# Patient Record
Sex: Male | Born: 1970 | Race: White | Hispanic: No | Marital: Married | Smoking: Never smoker
Health system: Southern US, Community
[De-identification: ages and names within clinical notes are randomized; demographics above are authoritative.]

## PROBLEM LIST (undated history)

## (undated) DIAGNOSIS — R7989 Other specified abnormal findings of blood chemistry: Secondary | ICD-10-CM

## (undated) DIAGNOSIS — E785 Hyperlipidemia, unspecified: Secondary | ICD-10-CM

## (undated) DIAGNOSIS — G4730 Sleep apnea, unspecified: Secondary | ICD-10-CM

## (undated) DIAGNOSIS — E079 Disorder of thyroid, unspecified: Secondary | ICD-10-CM

---

## 2015-11-07 ENCOUNTER — Encounter: Payer: Self-pay | Admitting: Emergency Medicine

## 2015-11-07 ENCOUNTER — Emergency Department (INDEPENDENT_AMBULATORY_CARE_PROVIDER_SITE_OTHER)
Admission: EM | Admit: 2015-11-07 | Discharge: 2015-11-07 | Disposition: A | Discharge status: Home / Self Care | Payer: Worker's Compensation | Source: Home / Self Care | Attending: Family Medicine | Admitting: Family Medicine

## 2015-11-07 DIAGNOSIS — Z026 Encounter for examination for insurance purposes: Secondary | ICD-10-CM

## 2015-11-07 DIAGNOSIS — S39012D Strain of muscle, fascia and tendon of lower back, subsequent encounter: Secondary | ICD-10-CM | POA: Diagnosis not present

## 2015-11-07 NOTE — ED Provider Notes (Signed)
CSN: [id number]     Arrival date & time 11/07/15  1455 History   First MD Initiated Contact with Patient 11/07/15 1509     Chief Complaint  Patient presents with  . Back Pain   (Consider location/radiation/quality/duration/timing/severity/associated sxs/prior Treatment) HPI  Pt is a 44yo male who works for [employer], presenting today for a f/u for a work related Left lower back injury that occurred initially on 10/12/15 and was seen initially on 10/21/15 in occupational health.  Pt was restricted for no lifting >5 pounds. Pt states the pain is gradually improving in his Left lower back so he has been trying to do a little more at work but another employee "called him out" for lifting more than he was cleared for.  Pt would like to increase his restriction to 10 pounds.  Pt states he is waiting to hear back from occupational health about a follow up visit as he was advised they are working on a referral to a specialist for his back.  Pain is still aching and sore on occasion. Denies new injuries or new symptoms. Denies change in bowel or bladder habit. Pain does not radiate to legs. Denies numbness in legs or groin.  He still takes meloxicam and flexeril as prescribed by occupational health. These medications do help pt.    History reviewed. No pertinent past medical history. History reviewed. No pertinent past surgical history. History reviewed. No pertinent family history. Social History  Substance Use Topics  . Smoking status: Never Smoker   . Smokeless tobacco: None  . Alcohol Use: Yes    Review of Systems  Constitutional: Negative for fever and chills.  Gastrointestinal: Negative for nausea, vomiting, abdominal pain and diarrhea.  Genitourinary: Negative for dysuria, frequency, hematuria and flank pain.  Musculoskeletal: Positive for myalgias and back pain. Negative for neck pain and neck stiffness.  Neurological: Negative for weakness and numbness.    Allergies  Review of patient's  allergies indicates not on file.  Home Medications   Prior to Admission medications   Medication Sig Start Date End Date Taking? Authorizing Provider  meloxicam (MOBIC) 15 MG tablet Take 15 mg by mouth daily.   Yes Historical Provider, MD   Meds Ordered and Administered this Visit  Medications - No data to display  BP 138/84 mmHg  Pulse 90  Temp(Src) 98.6 F (37 C) (Oral)  Wt 258 lb (117.028 kg)  SpO2 99% No data found.   Physical Exam  Constitutional: He is oriented to person, place, and time. He appears well-developed and well-nourished.  HENT:  Head: Normocephalic and atraumatic.  Eyes: EOM are normal.  Neck: Normal range of motion. Neck supple.  No midline bone tenderness, no crepitus or step-offs.    Cardiovascular: Normal rate.   Pulmonary/Chest: Effort normal.  Musculoskeletal: Normal range of motion. He exhibits tenderness. He exhibits no edema.  No midline spinal tenderness. Mild tenderness to Left lower lumbar muscles. No masses palpated. Full ROM upper and lower extremities with 5/5 strength bilaterally   Neurological: He is alert and oriented to person, place, and time.  Reflex Scores:      Patellar reflexes are 2+ on the right side and 2+ on the left side. Normal sensation in upper and lower extremities bilaterally. Normal gait  Skin: Skin is warm and dry. No rash noted. No erythema.  Psychiatric: He has a normal mood and affect. His behavior is normal.  Nursing note and vitals reviewed.   ED Course  Procedures (including critical care  time)  Labs Review Labs Reviewed - No data to display  Imaging Review No results found.    MDM   1. Low back strain, subsequent encounter   2. Encounter related to worker's compensation claim    Pt requesting decreased restrictions at work as his lower back pain is improving.  No new injury. No red flag symptoms.  Will increase weight to 10 pound limit as pt states he is currently at a 5 pound limit.  Encouraged  to f/u with occupational health, and to call the office of [facility] if he does not hear back by Wednesday 12/21.  Encouraged continued home exercises and alternating ice and heat. Continue to take medications as prescribed. Patient verbalized understanding and agreement with treatment plan.     Junius Finner, PA-C 11/07/15 [phone]

## 2015-11-07 NOTE — ED Notes (Signed)
Pt here for recheck for workers comp injury. States still having back pain when lifting heavy objects.

## 2015-11-12 ENCOUNTER — Telehealth: Payer: Self-pay | Admitting: *Deleted

## 2017-01-19 ENCOUNTER — Other Ambulatory Visit: Payer: Self-pay | Admitting: Emergency Medicine

## 2017-01-19 ENCOUNTER — Ambulatory Visit (INDEPENDENT_AMBULATORY_CARE_PROVIDER_SITE_OTHER): Payer: Self-pay

## 2017-01-19 DIAGNOSIS — R29890 Loss of height: Secondary | ICD-10-CM

## 2017-01-19 DIAGNOSIS — R52 Pain, unspecified: Secondary | ICD-10-CM

## 2017-01-19 DIAGNOSIS — M546 Pain in thoracic spine: Secondary | ICD-10-CM

## 2017-02-18 ENCOUNTER — Other Ambulatory Visit: Payer: Self-pay | Admitting: Emergency Medicine

## 2017-02-18 ENCOUNTER — Ambulatory Visit (INDEPENDENT_AMBULATORY_CARE_PROVIDER_SITE_OTHER): Payer: Self-pay

## 2017-02-18 DIAGNOSIS — Z09 Encounter for follow-up examination after completed treatment for conditions other than malignant neoplasm: Secondary | ICD-10-CM

## 2017-02-18 DIAGNOSIS — M438X4 Other specified deforming dorsopathies, thoracic region: Secondary | ICD-10-CM

## 2017-02-18 DIAGNOSIS — M4606 Spinal enthesopathy, lumbar region: Secondary | ICD-10-CM

## 2017-02-18 DIAGNOSIS — M4604 Spinal enthesopathy, thoracic region: Secondary | ICD-10-CM

## 2018-05-26 ENCOUNTER — Other Ambulatory Visit: Payer: Self-pay | Admitting: Gerontology

## 2018-05-26 ENCOUNTER — Ambulatory Visit (INDEPENDENT_AMBULATORY_CARE_PROVIDER_SITE_OTHER): Payer: Self-pay

## 2018-05-26 DIAGNOSIS — M549 Dorsalgia, unspecified: Secondary | ICD-10-CM

## 2018-05-26 DIAGNOSIS — M5136 Other intervertebral disc degeneration, lumbar region: Secondary | ICD-10-CM

## 2019-09-05 ENCOUNTER — Emergency Department (INDEPENDENT_AMBULATORY_CARE_PROVIDER_SITE_OTHER)
Admission: EM | Admit: 2019-09-05 | Discharge: 2019-09-05 | Disposition: A | Discharge status: Home / Self Care | Payer: No Typology Code available for payment source | Source: Home / Self Care | Attending: Emergency Medicine | Admitting: Emergency Medicine

## 2019-09-05 ENCOUNTER — Encounter: Payer: Self-pay | Admitting: *Deleted

## 2019-09-05 ENCOUNTER — Emergency Department (INDEPENDENT_AMBULATORY_CARE_PROVIDER_SITE_OTHER): Payer: Self-pay

## 2019-09-05 ENCOUNTER — Other Ambulatory Visit: Payer: Self-pay

## 2019-09-05 DIAGNOSIS — S39012A Strain of muscle, fascia and tendon of lower back, initial encounter: Secondary | ICD-10-CM | POA: Diagnosis not present

## 2019-09-05 DIAGNOSIS — S2341XA Sprain of ribs, initial encounter: Secondary | ICD-10-CM

## 2019-09-05 DIAGNOSIS — R0781 Pleurodynia: Secondary | ICD-10-CM

## 2019-09-05 HISTORY — DX: Hyperlipidemia, unspecified: E78.5

## 2019-09-05 HISTORY — DX: Sleep apnea, unspecified: G47.30

## 2019-09-05 HISTORY — DX: Other specified abnormal findings of blood chemistry: R79.89

## 2019-09-05 HISTORY — DX: Disorder of thyroid, unspecified: E07.9

## 2019-09-05 MED ORDER — MELOXICAM 7.5 MG PO TABS: ORAL_TABLET | ORAL | 0 refills | Status: AC

## 2019-09-05 MED ORDER — CYCLOBENZAPRINE HCL 5 MG PO TABS: 5.0000 mg | ORAL_TABLET | Freq: Every day | ORAL | 0 refills | Status: AC

## 2019-09-05 NOTE — Discharge Instructions (Addendum)
X-rays of mid back and left ribs are normal. You have a severe strain of left ribs and left mid back. Please see details of work restrictions and treatment plan as detailed on the [employer] medical report, completed by hand. You need to make an appointment for follow-up at employer health in 2 weeks, on 09/19/2019. We have initiated physical therapy referral

## 2019-09-05 NOTE — ED Provider Notes (Signed)
[facility]    CSN: [id number] Arrival date & time: 09/05/19  1130      History   Chief Complaint Chief Complaint  Patient presents with  . Back Injury    HPI Darrell Franklin is a 48 y.o. male.    Back Pain Pain location: Left rib and left parathoracic back area. 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 He states he had similar on-the-job back injuries in the past, most recently February 2020, and that it took a while for him to recover, but after couple months he eventually recovered and "was 99% better ". Since his injury 08/11/2019, he has tried to work, but bending and lifting and twisting heavy objects seems to make it worse.  Has tried meloxicam, total of 15 mg daily and cyclobenzaprine 5mg  hs for muscle relaxant, which helped somewhat. He states he went to his PCP yesterday, and had a variety of blood tests drawn, as he has been fatigued with various nonspecific myalgias and has a history of hypothyroid and low testosterone.  Denies history of hypertension or diabetes or cardiovascular disease. Associated symptoms: Denies numbness or focal weakness in any extremity. No fever or chills or nausea or vomiting.  No abdominal pain or diarrhea.  No anterior chest pain or shortness of breath.  No cough or congestion.  No history of exposure to anyone with COVID or COVID symptoms.    Past Medical History:  Diagnosis Date  . Hyperlipidemia   . Low testosterone   . Sleep apnea   . Thyroid disease     There are no active problems to display for this patient.   History reviewed. No pertinent  surgical history.     Home Medications    Prior to Admission medications   Medication Sig Start Date End Date Taking? Authorizing Provider  levothyroxine (SYNTHROID) 100 MCG tablet TAKE 1 TABLET (100 MCG TOTAL) BY MOUTH DAILY. 02/25/14  Yes [provider]  cyclobenzaprine (FLEXERIL) 5 MG tablet Take 1 tablet (5 mg total) by mouth at bedtime. For muscle relaxant 09/05/19   Jacqulyn Cane, MD  meloxicam (MOBIC) 7.5 MG tablet Take 1 twice a day as needed for pain. Take with food. (Do not take with any other NSAID.) 09/05/19   Jacqulyn Cane, MD    Family History Family History  Problem Relation Age of Onset  . Diabetes Mother   . Heart attack Father   . Hypertension Father     Social History Social History   Tobacco Use  . Smoking status: Never Smoker  . Smokeless tobacco: Never Used  Substance Use Topics  . Alcohol use: Yes  . Drug use: Never     Allergies   Aspirin Reviewed that aspirin in the past has caused GI upset and nosebleeds, however he has taken meloxicam in the past without any side effects.  Review of Systems Review of Systems  Constitutional: Positive for fatigue.  Musculoskeletal: Positive for back pain.  All other systems reviewed and are negative.  Pertinent items noted in HPI and remainder of comprehensive ROS otherwise negative.   Physical Exam Triage Vital  Signs ED Triage Vitals  Enc Vitals Group     BP 09/05/19 1204 (!) 145/81     Pulse Rate 09/05/19 1204 82     Resp 09/05/19 1204 16     Temp 09/05/19 1204 98.5 F (36.9 C)     Temp Source 09/05/19 1204 Oral     SpO2 09/05/19 1204 99 %     Weight 09/05/19 1206 256 lb (116.1 kg)     Height 09/05/19 1206 5\' 8"  (1.727 m)     Head Circumference --      Peak Flow --      Pain Score 09/05/19 1205 2     Pain Loc --      Pain Edu? --      Excl. in GC? --    No data found.  Updated Vital Signs BP (!) 145/81 (BP Location: Right Arm)   Pulse 82   Temp 98.5 F (36.9 C) (Oral)   Resp  16   Ht 5\' 8"  (1.727 m)   Wt 116.1 kg   SpO2 99%   BMI 38.92 kg/m    Physical Exam Vitals signs reviewed.  Constitutional:      General: He is not in acute distress.    Appearance: He is well-developed. He is not toxic-appearing.     Comments: Pleasant male, overweight.  No acute distress, but appears uncomfortable from left-sided back and rib pain.  HENT:     Head: Normocephalic and atraumatic.     Mouth/Throat:     Mouth: Mucous membranes are moist.  Eyes:     General: No scleral icterus.    Pupils: Pupils are equal, round, and reactive to light.  Neck:     Musculoskeletal: Normal range of motion and neck supple. No neck rigidity or muscular tenderness.  Cardiovascular:     Rate and Rhythm: Normal rate and regular rhythm.  Pulmonary:     Effort: Pulmonary effort is normal.     Breath sounds: Normal breath sounds.  Abdominal:     General: There is no distension.     Tenderness: There is no abdominal tenderness.  Musculoskeletal:     Thoracic back: He exhibits decreased range of motion. He exhibits no bony tenderness and no laceration.     Lumbar back: He exhibits decreased range of motion. He exhibits no bony tenderness.       Back:     Comments: As depicted, area of tenderness and spasm left lateral and posterior lateral ribs and left parathoracic area.  No spinal tenderness or deformity.  No rash.  Skin:    General: Skin is warm and dry.     Capillary Refill: Capillary refill takes less than 2 seconds.     Findings: No rash.  Neurological:     General: No focal deficit present.     Mental Status: He is alert and oriented to person, place, and time.     Cranial Nerves: No cranial nerve deficit.     Sensory: No sensory deficit.     Motor: No weakness.     Deep Tendon Reflexes: Reflexes normal.  Psychiatric:        Mood and Affect: Mood normal.        Behavior: Behavior normal.    12:35 PM.  Initial assessment, likely acute and/or recurrent injury of soft tissues  left ribs and left parathoracic area, but because of tenderness , especially left posterior lateral ribs, will order left rib x-rays now. (He has no point  tenderness over thoracic spine or LS spine, so spinal x-rays not indicated)  UC Treatments / Results  Labs (all labs ordered are listed, but only abnormal results are displayed) Labs Reviewed - No data to display  EKG   Radiology Dg Ribs Unilateral W/chest Left  Result Date: 09/05/2019 CLINICAL DATA:  Left rib pain. EXAM: LEFT RIBS AND CHEST - 3+ VIEW COMPARISON:  Thoracic spine 02/18/2017 FINDINGS: Both lungs are clear. Normal appearance of the heart and mediastinum. Negative for a pneumothorax. Left ribs are intact without a displaced fracture. No acute bone abnormality. IMPRESSION: Negative. Electronically Signed   By: Richarda Overlie M.D.   On: 09/05/2019 12:54    Procedures Procedures (including critical care time)  Medications Ordered in UC Medications - No data to display  Initial Impression / Assessment and Plan / UC Course  I have reviewed the triage vital signs and the nursing notes.  Pertinent labs & imaging results that were available during my care of the patient were reviewed by me and considered in my medical decision making (see chart for details).     Left rib x-rays including 1 view chest x-ray, within normal limits.  No fracture or bony abnormality.-Reviewed this with patient.  I also urged him to follow-up ongoing with his PCP for further evaluation and treatment of fatigue and general myalgias, as he might have metabolic cause for those symptoms.-He voiced understanding Final Clinical Impressions(s) / UC Diagnoses   Final diagnoses:  Back strain, initial encounter  Sprain of costal cartilage, initial encounter     Discharge Instructions     X-rays of mid back and left ribs are normal. You have a severe strain of left ribs and left mid back. Please see details of work restrictions and treatment plan as  detailed on the [employer] medical report, completed by hand. You need to make an appointment for follow-up at employer health in 2 weeks, on 09/19/2019. We have initiated physical therapy referral    ED Prescriptions    Medication Sig Dispense Auth. Provider   meloxicam (MOBIC) 7.5 MG tablet Take 1 twice a day as needed for pain. Take with food. (Do not take with any other NSAID.) 30 tablet Lajean Manes, MD   cyclobenzaprine (FLEXERIL) 5 MG tablet Take 1 tablet (5 mg total) by mouth at bedtime. For muscle relaxant 14 tablet Lajean Manes, MD     Worker's Comp Information  Date of injury: 08/11/2019 Return To Work: 09/06/2019   Work Restrictions: No lifting over 10 pounds.  Please see other specific work restrictions, detailed in the [employer] Report form, completed by hand.  Referral : Physical therapy, 3 times a week x 2 weeks.-In my opinion, this will likely hasten his improvement.  MAKE FOLLOW-UP APPOINTMENT: For 09/19/2019:  [facility] [phone] [address], [address]  [place], [place] [address]    An After Visit Summary was printed and given to the patient. Also, verbal instructions and he was given copy of [employer] Medical Report form.   Lajean Manes, MD 09/05/19 [phone]

## 2019-09-05 NOTE — ED Triage Notes (Addendum)
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

## 2021-03-09 IMAGING — DX DG RIBS W/ CHEST 3+V*L*
5 series · 5 of 5 positions shown · non-contrast
Comparison: Thoracic spine 02/18/2017

CLINICAL DATA: Left rib pain.

EXAM:
LEFT RIBS AND CHEST - 3+ VIEW

[chest pa]
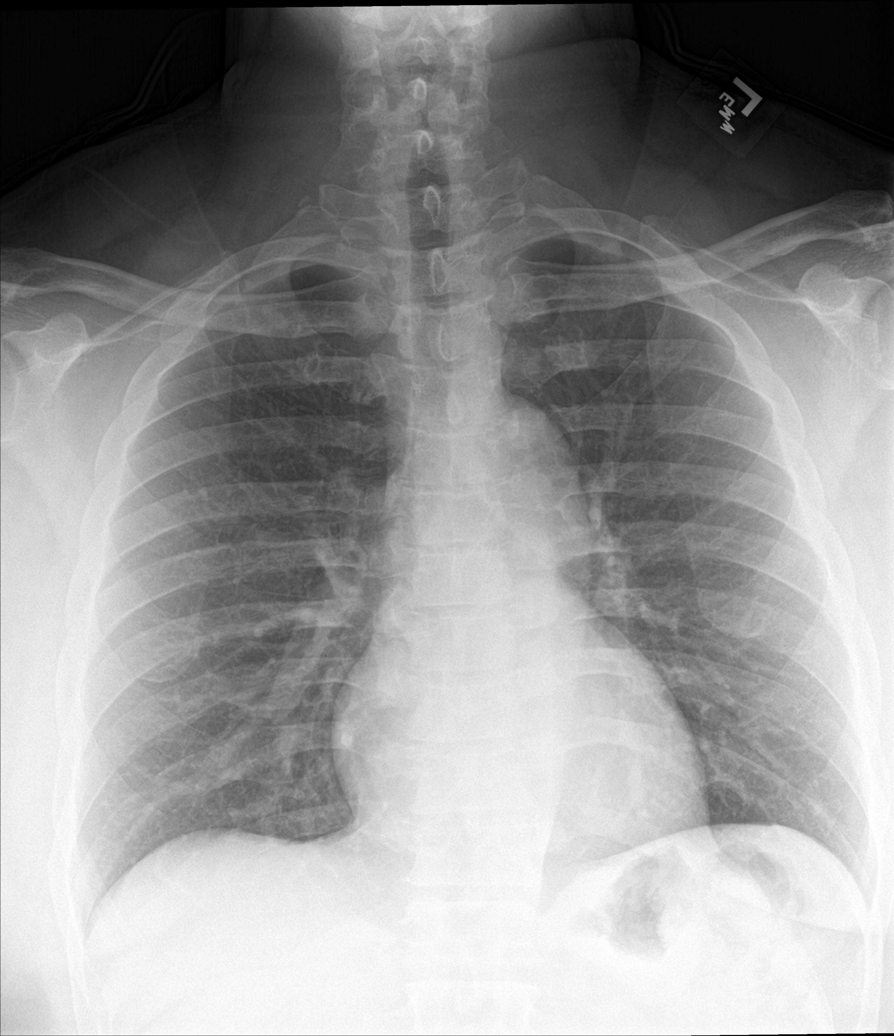

[rib pa (1 of 2)]
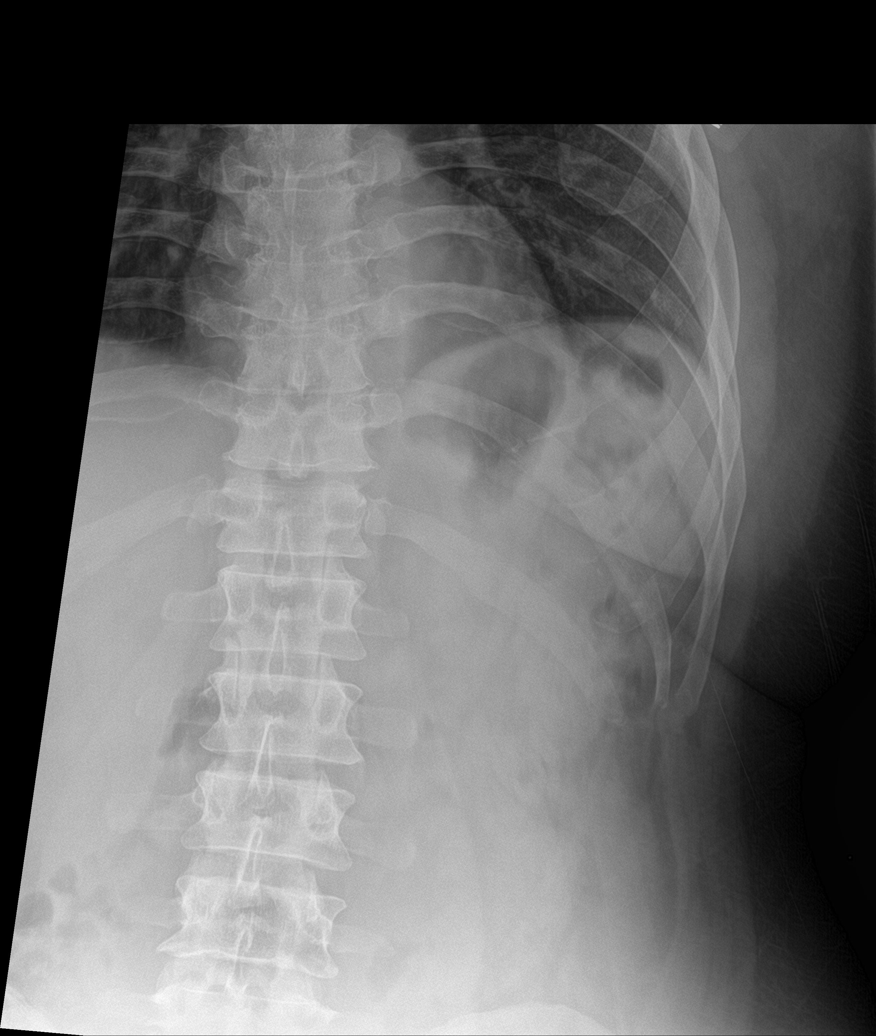

[rib pa (2 of 2)]
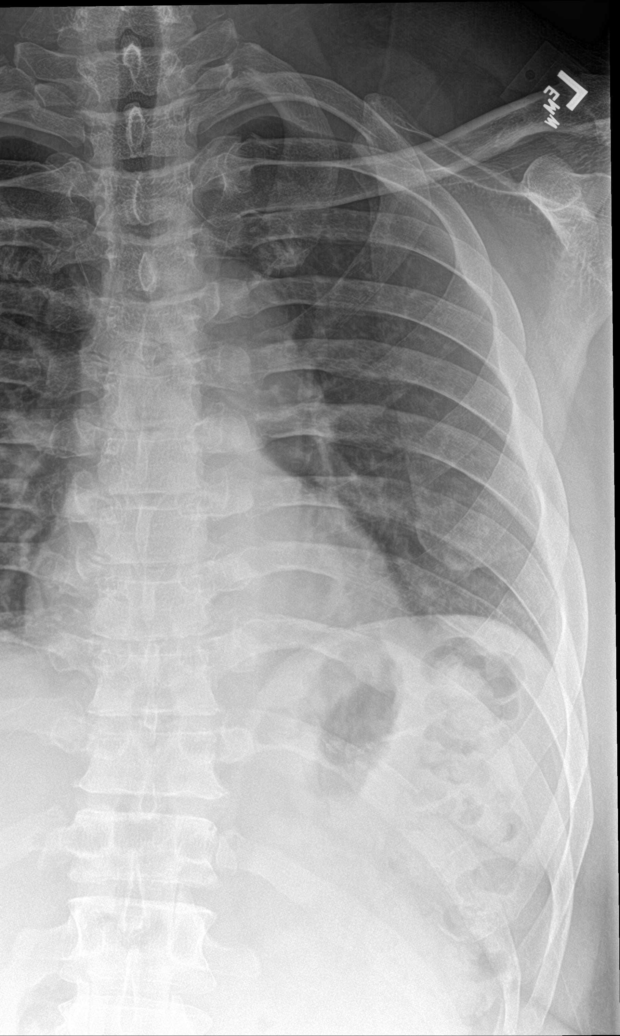

[rib pa obl (1 of 2)]
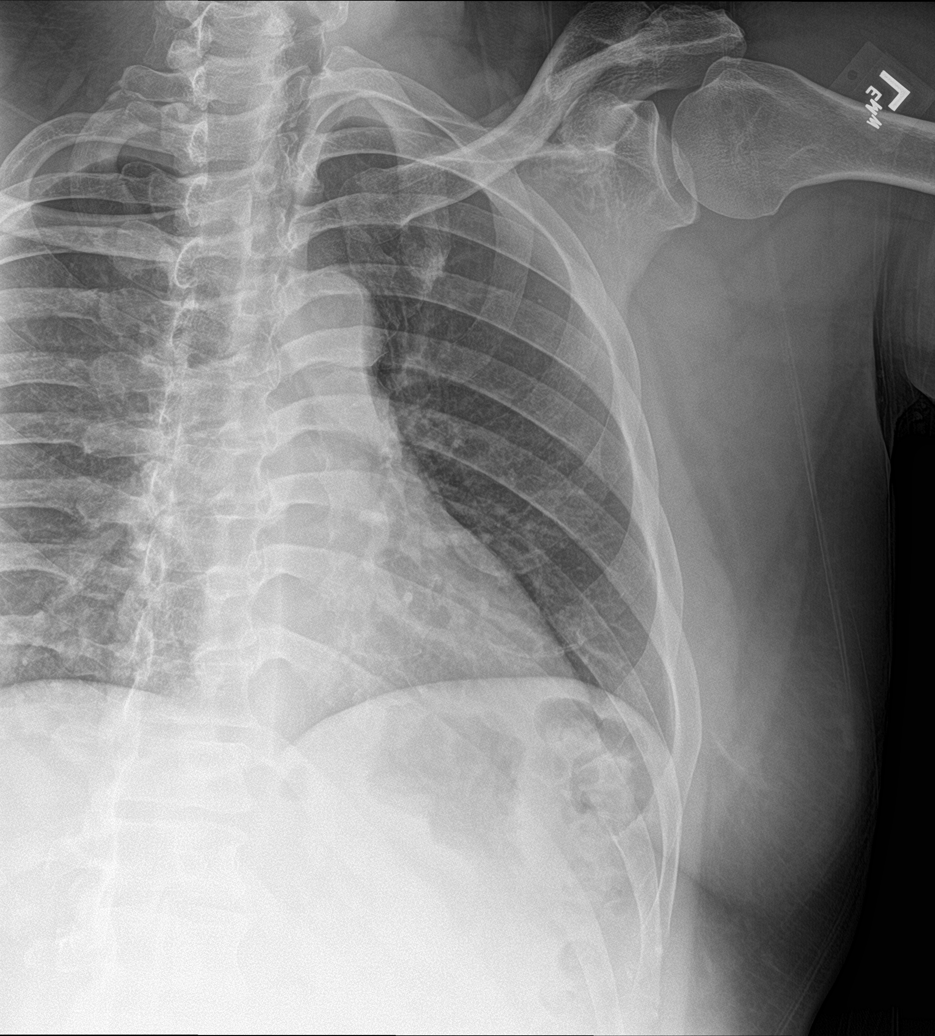

[rib pa obl (2 of 2)]
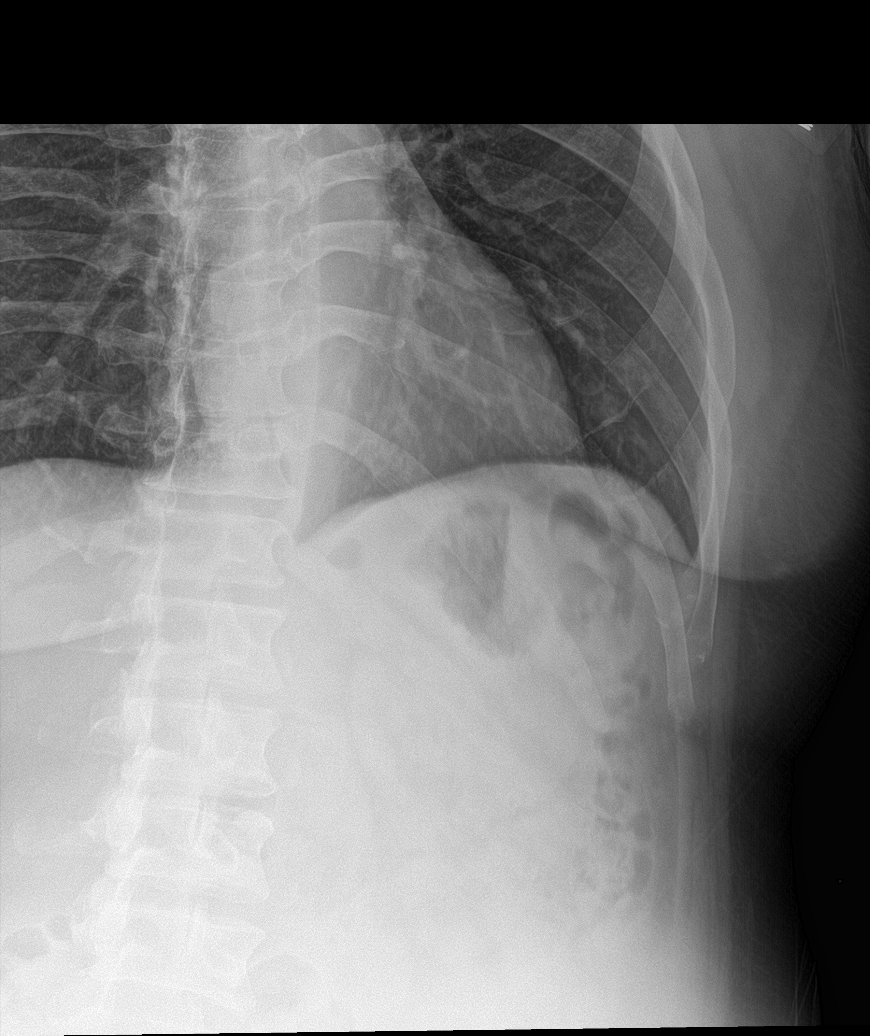

[5 of 5 positions shown; findings below may reference images not displayed]

FINDINGS: Both lungs are clear. Normal appearance of the heart and
mediastinum. Negative for a pneumothorax. Left ribs are intact
without a displaced fracture. No acute bone abnormality.
IMPRESSION: Negative.
